# Patient Record
Sex: Female | Born: 1965 | Race: Black or African American | Hispanic: No | Marital: Single | Smoking: Current every day smoker
Health system: Southern US, Community
[De-identification: ages and names within clinical notes are randomized; demographics above are authoritative.]

## PROBLEM LIST (undated history)

## (undated) DIAGNOSIS — K5090 Crohn's disease, unspecified, without complications: Secondary | ICD-10-CM

## (undated) HISTORY — PX: CHOLECYSTECTOMY: SHX55

## (undated) HISTORY — PX: TUBAL LIGATION: SHX77

## (undated) HISTORY — PX: TONSILLECTOMY: SUR1361

## (undated) HISTORY — PX: ABDOMINAL SURGERY: SHX537

---

## 2004-08-13 ENCOUNTER — Emergency Department: Payer: Self-pay | Admitting: Emergency Medicine

## 2004-08-19 ENCOUNTER — Emergency Department: Payer: Self-pay | Admitting: Emergency Medicine

## 2004-09-02 ENCOUNTER — Emergency Department: Payer: Self-pay | Admitting: Emergency Medicine

## 2006-07-24 ENCOUNTER — Ambulatory Visit: Payer: Self-pay | Admitting: Unknown Physician Specialty

## 2006-11-09 ENCOUNTER — Ambulatory Visit: Payer: Self-pay | Admitting: Internal Medicine

## 2007-01-22 ENCOUNTER — Ambulatory Visit: Payer: Self-pay | Admitting: Gastroenterology

## 2007-03-05 ENCOUNTER — Ambulatory Visit: Payer: Self-pay | Admitting: Unknown Physician Specialty

## 2012-02-08 ENCOUNTER — Emergency Department (HOSPITAL_COMMUNITY)
Admission: EM | Admit: 2012-02-08 | Discharge: 2012-02-08 | Disposition: A | Discharge status: Home / Self Care | Payer: Self-pay | Attending: Emergency Medicine | Admitting: Emergency Medicine

## 2012-02-08 ENCOUNTER — Encounter (HOSPITAL_COMMUNITY): Payer: Self-pay | Admitting: Nurse Practitioner

## 2012-02-08 DIAGNOSIS — K5090 Crohn's disease, unspecified, without complications: Secondary | ICD-10-CM | POA: Insufficient documentation

## 2012-02-08 DIAGNOSIS — F17200 Nicotine dependence, unspecified, uncomplicated: Secondary | ICD-10-CM | POA: Insufficient documentation

## 2012-02-08 DIAGNOSIS — L0231 Cutaneous abscess of buttock: Secondary | ICD-10-CM | POA: Insufficient documentation

## 2012-02-08 DIAGNOSIS — Z880 Allergy status to penicillin: Secondary | ICD-10-CM | POA: Insufficient documentation

## 2012-02-08 HISTORY — DX: Crohn's disease, unspecified, without complications: K50.90

## 2012-02-08 MED ORDER — HYDROCODONE-ACETAMINOPHEN 5-325 MG PO TABS
1.0000 | ORAL_TABLET | Freq: Once | ORAL | Status: AC
  Administered 2012-02-08: 1 via ORAL

## 2012-02-08 MED ORDER — HYDROCODONE-ACETAMINOPHEN 5-325 MG PO TABS
ORAL_TABLET | ORAL | Status: AC
  Filled 2012-02-08: qty 1

## 2012-02-08 NOTE — ED Notes (Signed)
States " i have a cyst on my tailbone since last week." denies drainage. Reports painful at site

## 2012-02-08 NOTE — ED Provider Notes (Signed)
History     CSN: [id number]  Arrival date & time 02/08/12  7846   First MD Initiated Contact with Patient 02/08/12 [phone]      Chief Complaint  Patient presents with  . Cyst    (Consider location/radiation/quality/duration/timing/severity/associated sxs/prior treatment) HPI Comments: Patient presents with an abscess to the gluteal cleft. This has been present for almost a week now and has gradually been worsening. No drainage noted from the area. Patient states the area is very painful when she sits. She has had the area drained in the past and was told that she may have a pilonidal cyst. She was instructed to followup with surgery for this but has not done so as of yet. She denies fever or chills. She has not noticed any overlying skin redness to the area.  Patient is a 46 y.o. female presenting with abscess. The history is provided by the patient.  Abscess  This is a new problem. The current episode started less than one week ago. The onset was gradual. The problem occurs continuously. The problem has been gradually worsening. The abscess is present on the right buttock. The problem is severe. The abscess is characterized by painfulness. Pertinent negatives include no fever. She has received no recent medical care.    Past Medical History  Diagnosis Date  . Crohn's disease     Past Surgical History  Procedure Date  . Abdominal surgery     History reviewed. No pertinent family history.  History  Substance Use Topics  . Smoking status: Current Everyday Smoker -- 0.5 packs/day  . Smokeless tobacco: Not on file  . Alcohol Use: Yes     10    OB History    Grav Para Term Preterm Abortions TAB SAB Ect Mult Living                  Review of Systems  Constitutional: Negative for fever.  Gastrointestinal: Negative for anal bleeding and rectal pain.  Skin: Positive for wound. Negative for color change.    Allergies  Codeine; Penicillins; and Percocet  Home Medications    Current Outpatient Rx  Name Route Sig Dispense Refill  . CALCIUM CARBONATE ANTACID 500 MG PO CHEW Oral Chew 2 tablets by mouth 3 (three) times daily as needed. Stomach indigestion      BP 161/110  Pulse 113  Temp 98.6 F (37 C) (Oral)  Resp 20  Ht 5\' 2"  (1.575 m)  Wt 170 lb (77.111 kg)  BMI 31.09 kg/m2  SpO2 100% Tachycardic which is likely 2/2 pain; pt is uncomfortable  Physical Exam  Nursing note and vitals reviewed. Constitutional: She appears well-developed and well-nourished. No distress.       Uncomfortable appearing  HENT:  Head: Normocephalic and atraumatic.  Eyes:       Normal appearance  Neck: Normal range of motion.  Cardiovascular: Regular rhythm and normal heart sounds.  Tachycardia present.   Pulmonary/Chest: Effort normal and breath sounds normal.  Abdominal: Soft. There is no tenderness.  Musculoskeletal: Normal range of motion.  Neurological: She is alert.  Skin: Skin is warm and dry. She is not diaphoretic.       Small, ~1cm abscess to upper gluteal cleft, sm amount purulent drainage noted; no overlying cellullitis; area exquisitely tender to palp  Psychiatric: She has a normal mood and affect.    ED Course  Procedures (including critical care time)  INCISION AND DRAINAGE Performed by: Grant Fontana Consent: Verbal consent obtained. Risks and  benefits: risks, benefits and alternatives were discussed Type: abscess  Body area: gluteal cleft  Anesthesia: local infiltration  Local anesthetic: lidocaine 2% with epinephrine  Anesthetic total: 3 ml  Complexity: complex Blunt dissection to break up loculations  Drainage: purulent  Drainage amount: mod  Packing material: 1/4 in iodoform gauze  Patient tolerance: poorly    Labs Reviewed - No data to display No results found.   1. Abscess of buttock       MDM  Pt with poss pilonidal abscess. Area incised and drained which was productive of purulent material; it was packed.  Instructed to return in 2 days for a wound recheck. Wound care discussed. Instructed to f/u with surgery for further eval. Reasons to return to the ED discussed. Pt verbalized understanding, agreed to plan.       Grant Fontana, PA-C 02/10/12 [phone]

## 2012-02-10 NOTE — ED Provider Notes (Signed)
Medical screening examination/treatment/procedure(s) were performed by non-physician practitioner and as supervising physician I was immediately available for consultation/collaboration.  Esvin Hnat, MD 02/10/12 1253 

## 2012-03-05 ENCOUNTER — Ambulatory Visit (INDEPENDENT_AMBULATORY_CARE_PROVIDER_SITE_OTHER): Payer: Self-pay | Admitting: General Surgery

## 2012-03-08 ENCOUNTER — Encounter (INDEPENDENT_AMBULATORY_CARE_PROVIDER_SITE_OTHER): Payer: Self-pay | Admitting: General Surgery

## 2014-01-03 ENCOUNTER — Emergency Department (HOSPITAL_COMMUNITY)
Admission: EM | Admit: 2014-01-03 | Discharge: 2014-01-03 | Disposition: A | Discharge status: Home / Self Care | Payer: Managed Care, Other (non HMO) | Attending: Emergency Medicine | Admitting: Emergency Medicine

## 2014-01-03 ENCOUNTER — Emergency Department (HOSPITAL_COMMUNITY): Payer: Managed Care, Other (non HMO)

## 2014-01-03 ENCOUNTER — Encounter (HOSPITAL_COMMUNITY): Payer: Self-pay | Admitting: Emergency Medicine

## 2014-01-03 DIAGNOSIS — Z9851 Tubal ligation status: Secondary | ICD-10-CM | POA: Insufficient documentation

## 2014-01-03 DIAGNOSIS — Z3202 Encounter for pregnancy test, result negative: Secondary | ICD-10-CM | POA: Insufficient documentation

## 2014-01-03 DIAGNOSIS — Z880 Allergy status to penicillin: Secondary | ICD-10-CM | POA: Insufficient documentation

## 2014-01-03 DIAGNOSIS — IMO0002 Reserved for concepts with insufficient information to code with codable children: Secondary | ICD-10-CM | POA: Insufficient documentation

## 2014-01-03 DIAGNOSIS — F17200 Nicotine dependence, unspecified, uncomplicated: Secondary | ICD-10-CM | POA: Insufficient documentation

## 2014-01-03 DIAGNOSIS — Z79899 Other long term (current) drug therapy: Secondary | ICD-10-CM | POA: Insufficient documentation

## 2014-01-03 DIAGNOSIS — Z792 Long term (current) use of antibiotics: Secondary | ICD-10-CM | POA: Insufficient documentation

## 2014-01-03 DIAGNOSIS — K5000 Crohn's disease of small intestine without complications: Secondary | ICD-10-CM | POA: Insufficient documentation

## 2014-01-03 DIAGNOSIS — Z9089 Acquired absence of other organs: Secondary | ICD-10-CM | POA: Insufficient documentation

## 2014-01-03 LAB — CBC WITH DIFFERENTIAL/PLATELET
BASOS ABS: 0.1 10*3/uL (ref 0.0–0.1)
BASOS PCT: 1 % (ref 0–1)
Eosinophils Absolute: 0.3 10*3/uL (ref 0.0–0.7)
Eosinophils Relative: 2 % (ref 0–5)
HEMATOCRIT: 35.8 % — AB (ref 36.0–46.0)
Hemoglobin: 11.5 g/dL — ABNORMAL LOW (ref 12.0–15.0)
Lymphocytes Relative: 33 % (ref 12–46)
Lymphs Abs: 3.4 10*3/uL (ref 0.7–4.0)
MCH: 27.5 pg (ref 26.0–34.0)
MCHC: 32.1 g/dL (ref 30.0–36.0)
MCV: 85.6 fL (ref 78.0–100.0)
MONO ABS: 0.7 10*3/uL (ref 0.1–1.0)
Monocytes Relative: 7 % (ref 3–12)
NEUTROS ABS: 5.9 10*3/uL (ref 1.7–7.7)
Neutrophils Relative %: 57 % (ref 43–77)
PLATELETS: 452 10*3/uL — AB (ref 150–400)
RBC: 4.18 MIL/uL (ref 3.87–5.11)
RDW: 17.8 % — AB (ref 11.5–15.5)
WBC: 10.4 10*3/uL (ref 4.0–10.5)

## 2014-01-03 LAB — COMPREHENSIVE METABOLIC PANEL
ALBUMIN: 3.6 g/dL (ref 3.5–5.2)
ALT: 12 U/L (ref 0–35)
AST: 30 U/L (ref 0–37)
Alkaline Phosphatase: 140 U/L — ABNORMAL HIGH (ref 39–117)
BILIRUBIN TOTAL: 0.3 mg/dL (ref 0.3–1.2)
BUN: 9 mg/dL (ref 6–23)
CALCIUM: 9.6 mg/dL (ref 8.4–10.5)
CHLORIDE: 102 meq/L (ref 96–112)
CO2: 22 mEq/L (ref 19–32)
CREATININE: 1 mg/dL (ref 0.50–1.10)
GFR calc Af Amer: 78 mL/min — ABNORMAL LOW (ref >90)
GFR calc non Af Amer: 67 mL/min — ABNORMAL LOW (ref >90)
Glucose, Bld: 106 mg/dL — ABNORMAL HIGH (ref 70–99)
Potassium: 4.3 mEq/L (ref 3.7–5.3)
SODIUM: 136 meq/L — AB (ref 137–147)
Total Protein: 7.7 g/dL (ref 6.0–8.3)

## 2014-01-03 LAB — URINALYSIS, ROUTINE W REFLEX MICROSCOPIC
Bilirubin Urine: NEGATIVE
GLUCOSE, UA: NEGATIVE mg/dL
Hgb urine dipstick: NEGATIVE
Ketones, ur: NEGATIVE mg/dL
LEUKOCYTES UA: NEGATIVE
Nitrite: NEGATIVE
PH: 5.5 (ref 5.0–8.0)
Protein, ur: NEGATIVE mg/dL
Specific Gravity, Urine: 1.02 (ref 1.005–1.030)
Urobilinogen, UA: 0.2 mg/dL (ref 0.0–1.0)

## 2014-01-03 LAB — PREGNANCY, URINE: PREG TEST UR: NEGATIVE

## 2014-01-03 LAB — LIPASE, BLOOD: Lipase: 36 U/L (ref 11–59)

## 2014-01-03 MED ORDER — IOHEXOL 300 MG/ML  SOLN
20.0000 mL | INTRAMUSCULAR | Status: DC
  Administered 2014-01-03: 20 mL via ORAL

## 2014-01-03 MED ORDER — PREDNISONE 20 MG PO TABS
60.0000 mg | ORAL_TABLET | Freq: Once | ORAL | Status: AC
  Administered 2014-01-03: 60 mg via ORAL
  Filled 2014-01-03: qty 3

## 2014-01-03 MED ORDER — HYDROCODONE-ACETAMINOPHEN 5-325 MG PO TABS: 2.0000 | ORAL_TABLET | ORAL | Status: AC | PRN

## 2014-01-03 MED ORDER — CIPROFLOXACIN HCL 500 MG PO TABS
500.0000 mg | ORAL_TABLET | Freq: Once | ORAL | Status: AC
  Administered 2014-01-03: 500 mg via ORAL
  Filled 2014-01-03: qty 1

## 2014-01-03 MED ORDER — ONDANSETRON HCL 4 MG/2ML IJ SOLN
4.0000 mg | Freq: Once | INTRAMUSCULAR | Status: AC
  Administered 2014-01-03: 4 mg via INTRAVENOUS
  Filled 2014-01-03: qty 2

## 2014-01-03 MED ORDER — CIPROFLOXACIN HCL 500 MG PO TABS: 500.0000 mg | ORAL_TABLET | Freq: Two times a day (BID) | ORAL | Status: AC

## 2014-01-03 MED ORDER — IOHEXOL 300 MG/ML  SOLN: 100.0000 mL | Freq: Once | INTRAMUSCULAR | Status: DC | PRN

## 2014-01-03 MED ORDER — PREDNISONE 20 MG PO TABS: 60.0000 mg | ORAL_TABLET | Freq: Every day | ORAL | Status: AC

## 2014-01-03 MED ORDER — MORPHINE SULFATE 4 MG/ML IJ SOLN
4.0000 mg | Freq: Once | INTRAMUSCULAR | Status: AC
  Administered 2014-01-03: 4 mg via INTRAVENOUS
  Filled 2014-01-03: qty 1

## 2014-01-03 NOTE — Discharge Instructions (Signed)
Crohn's Disease Crohn's disease is a long-term (chronic) soreness and redness (inflammation) of the intestines (bowel). It can affect any portion of the digestive tract, from the mouth to the anus. It can also cause problems outside the digestive tract. Crohn's disease is closely related to a disease called ulcerative colitis (together, these two diseases are called inflammatory bowel disease).  CAUSES  The cause of Crohn's disease is not known. One theory is that, in an easily affected person, the immune system is triggered to attack the body's own digestive tissue. Crohn's disease runs in families. It seems to be more common in certain geographic areas and amongst certain races. There are no clear-cut dietary causes.  SYMPTOMS  Crohn's disease can cause many different symptoms since it can affect many different parts of the body. Symptoms include:  Fatigue.  Weight loss.  Chronic diarrhea, sometime bloody.  Abdominal pain and cramps.  Fever.  Ulcers or canker sores in the mouth or rectum.  Anemia (low red blood cells).  Arthritis, skin problems, and eye problems may occur. Complications of Crohn's disease can include:  Series of holes (perforation) of the bowel.  Portions of the intestines sticking to each other (adhesions).  Obstruction of the bowel.  Fistula formation, typically in the rectal area but also in other areas. A fistula is an opening between the bowels and the outside, or between the bowels and another organ.  A painful crack in the mucous membrane of the anus (rectal fissure). DIAGNOSIS  Your caregiver may suspect Crohn's disease based on your symptoms and an exam. Blood tests may confirm that there is a problem. You may be asked to submit a stool specimen for examination. X-rays and CT scans may be necessary. Ultimately, the diagnosis is usually made after a procedure that uses a flexible tube that is inserted via your mouth or your anus. This is done under  sedation and is called either an upper endoscopy or colonoscopy. With these tests, the specialist can take tiny tissue samples and remove them from the inside of the bowel (biopsy). Examination of this biopsy tissue under a microscope can reveal Crohn's disease as the cause of your symptoms. Due to the many different forms that Crohn's disease can take, symptoms may be present for several years before a diagnosis is made. TREATMENT  Medications are often used to decrease inflammation and control the immune system. These include medicines related to aspirin, steroid medications, and newer and stronger medications to slow down the immune system. Some medications may be used as suppositories or enemas. A number of other medications are used or have been studied. Your caregiver will make specific recommendations. HOME CARE INSTRUCTIONS   Symptoms such as diarrhea can be controlled with medications. Avoid foods that have a laxative effect such as fresh fruit, vegetables and dairy products. During flare ups, you can rest your bowel by refraining from solid foods. Drink clear liquids frequently during the day (electrolyte or re-hydrating fluids are best. Your caregiver can help you with suggestions). Drink often to prevent loss of body fluids (dehydration). When diarrhea has cleared, eat small meals and more frequently. Avoid food additives and stimulants such as caffeine (coffee, tea, or chocolate). Enzyme supplements may help if you develop intolerance to a sugar in dairy products (lactose). Ask your caregiver or dietitian about specific dietary instructions.  Try to maintain a positive attitude. Learn relaxation techniques such as self hypnosis, mental imaging, and muscle relaxation.  If possible, avoid stresses which can aggravate your condition.    Exercise regularly.  Follow your diet.  Always get plenty of rest. SEEK MEDICAL CARE IF:   Your symptoms fail to improve after a week or two of new  treatment.  You experience continued weight loss.  You have ongoing cramps or loose bowels.  You develop a new skin rash, skin sores, or eye problems. SEEK IMMEDIATE MEDICAL CARE IF:   You have worsening of your symptoms or develop new symptoms.  You have a fever.  You develop bloody diarrhea.  You develop severe abdominal pain. MAKE SURE YOU:   Understand these instructions.  Will watch your condition.  Will get help right away if you are not doing well or get worse. Document Released: 04/30/2005 Document Revised: 11/15/2012 Document Reviewed: 03/29/2007 [employer] Patient Information 2014 [employer].  

## 2014-01-03 NOTE — ED Notes (Signed)
CT notified that patient has finished oral contrast.

## 2014-01-03 NOTE — ED Provider Notes (Signed)
CSN: [id number]     Arrival date & time 01/03/14  0326 History   First MD Initiated Contact with Patient 01/03/14 0344     Chief Complaint  Patient presents with  . Abdominal Pain     (Consider location/radiation/quality/duration/timing/severity/associated sxs/prior Treatment) HPI  48 year old female presents to emergency department from home with complaint of abdominal pain.  Patient has history of Crohn's disease.  She had surgery 2 years ago, had cecum removed.  She was told at that time that she had a large hernia and would need to followup with the surgeon.  She is followed up on June 10 at [facility] with her surgeon.  Patient reports over the last 3 weeks she has had worsening abdominal pain.  Pain is now constant.  She denies nausea vomiting or diarrhea.  She reports that her abdomen is distended.  She has had some constipation over the last few days. Past Medical History  Diagnosis Date  . Crohn's disease    Past Surgical History  Procedure Laterality Date  . Abdominal surgery    . Cholecystectomy    . Tonsillectomy    . Tubal ligation     No family history on file. History  Substance Use Topics  . Smoking status: Current Every Day Smoker -- 0.50 packs/day  . Smokeless tobacco: Not on file  . Alcohol Use: Yes     Comment: 10   OB History   Grav Para Term Preterm Abortions TAB SAB Ect Mult Living                 Review of Systems  See History of Present Illness; otherwise all other systems are reviewed and negative   Allergies  Codeine; Penicillins; and Percocet  Home Medications   Prior to Admission medications   Medication Sig Start Date End Date Taking? Authorizing Provider  calcium carbonate (TUMS - DOSED IN MG ELEMENTAL CALCIUM) 500 MG chewable tablet Chew 2 tablets by mouth 3 (three) times daily as needed. Stomach indigestion   Yes Historical Provider, MD  ciprofloxacin (CIPRO) 500 MG tablet Take 1 tablet (500 mg total) by mouth 2 (two) times daily. 01/03/14    Kalman Drape, MD  HYDROcodone-acetaminophen (NORCO/VICODIN) 5-325 MG per tablet Take 2 tablets by mouth every 4 (four) hours as needed for moderate pain or severe pain. 01/03/14   Kalman Drape, MD  predniSONE (DELTASONE) 20 MG tablet Take 3 tablets (60 mg total) by mouth daily. 01/03/14   Kalman Drape, MD   BP 139/84  Pulse 74  Temp(Src) 97.3 F (36.3 C) (Oral)  Resp 15  Ht 5\' 3"  (1.6 m)  Wt 168 lb (76.204 kg)  BMI 29.77 kg/m2  SpO2 100% Physical Exam  Nursing note and vitals reviewed. Constitutional: She is oriented to person, place, and time. She appears well-developed and well-nourished. She appears distressed (uncomfortable appearing).  HENT:  Head: Normocephalic and atraumatic.  Right Ear: External ear normal.  Left Ear: External ear normal.  Nose: Nose normal.  Mouth/Throat: Oropharynx is clear and moist.  Eyes: Conjunctivae and EOM are normal. Pupils are equal, round, and reactive to light.  Neck: Normal range of motion. Neck supple. No JVD present. No tracheal deviation present. No thyromegaly present.  Cardiovascular: Normal rate, regular rhythm, normal heart sounds and intact distal pulses.  Exam reveals no gallop and no friction rub.   No murmur heard. Pulmonary/Chest: Effort normal and breath sounds normal. No stridor. No respiratory distress. She has no wheezes. She has  no rales. She exhibits no tenderness.  Abdominal: Soft. Bowel sounds are normal. She exhibits mass (patient has a ventral hernia that is easily reducible.). She exhibits no distension. There is tenderness (patient has diffuse midline tenderness). There is no rebound and no guarding.  Musculoskeletal: Normal range of motion. She exhibits no edema and no tenderness.  Lymphadenopathy:    She has no cervical adenopathy.  Neurological: She is alert and oriented to person, place, and time. She exhibits normal muscle tone. Coordination normal.  Skin: Skin is warm and dry. No rash noted. No erythema. No pallor.   Psychiatric: She has a normal mood and affect. Her behavior is normal. Judgment and thought content normal.    ED Course  Procedures (including critical care time) Labs Review Labs Reviewed  CBC WITH DIFFERENTIAL - Abnormal; Notable for the following:    Hemoglobin 11.5 (*)    HCT 35.8 (*)    RDW 17.8 (*)    Platelets 452 (*)    All other components within normal limits  COMPREHENSIVE METABOLIC PANEL - Abnormal; Notable for the following:    Sodium 136 (*)    Glucose, Bld 106 (*)    Alkaline Phosphatase 140 (*)    GFR calc non Af Amer 67 (*)    GFR calc Af Amer 78 (*)    All other components within normal limits  LIPASE, BLOOD  URINALYSIS, ROUTINE W REFLEX MICROSCOPIC  PREGNANCY, URINE    Imaging Review Ct Abdomen Pelvis W Contrast  01/03/2014   CLINICAL DATA:  Crohn's disease, hernia, 3 week abdominal pain and distention.  EXAM: CT ABDOMEN AND PELVIS WITH CONTRAST  TECHNIQUE: Multidetector CT imaging of the abdomen and pelvis was performed using the standard protocol following bolus administration of intravenous contrast.  CONTRAST:  100 cc Omnipaque 300  COMPARISON:  None.  FINDINGS: Included view of the lung bases are clear. Visualized heart and pericardium are unremarkable.  The liver, spleen, pancreas and adrenal glands are unremarkable. Status post cholecystectomy.  Status post apparent sick CT scanning, with circumferential wall thickening, mesenteric edema of the distal ileum extending to the surgical anastomosis, coronal 44/111. Enteric contrast has not yet reached the distal small bowel. No pneumatosis, no abscess. The stomach, large bowel are normal in course and caliber without inflammatory changes. No intraperitoneal free fluid nor free air.  Kidneys are orthotopic, demonstrating symmetric enhancement without nephrolithiasis, hydronephrosis or renal masses. The unopacified ureters are normal in course and caliber. Delayed imaging through the kidneys demonstrates symmetric  prompt excretion to the proximal urinary collecting system. Urinary bladder is partially distended and unremarkable.  Aortoiliac vessels are normal in course and caliber. No lymphadenopathy by CT size criteria. 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 Moderate fat containing ventral hernia.  IMPRESSION: Inflammatory changes of the distal ileum likely reflecting acute Crohn's flare, without abscess or obstruction, status post apparent cecectomy.   Electronically Signed   By: Elon Alas   On: 01/03/2014 05:34     EKG Interpretation   Date/Time:  Tuesday January 03 2014 03:30:34 EDT Ventricular Rate:  100 PR Interval:  168 QRS Duration: 74 QT Interval:  344 QTC Calculation: 443 R Axis:   37 Text Interpretation:  Normal sinus rhythm Septal infarct , age  undetermined Abnormal ECG No old tracing to compare Confirmed by Michaelpaul Apo   MD, Tyson Masin ([id number]) on 01/03/2014 5:38:02 AM      MDM   Final diagnoses:  Crohn's disease involving terminal  ileum   48 year old female with abdominal pain, history of Crohn's disease, bowel obstruction, and prior bowel resection.  She has known hernia.  Plan for labs, CT scan.  Differential includes incarcerated hernia, small bowel obstruction, Crohn's flare.   Patient with Crohn's flare.  We'll send home with Cipro and prednisone.  She has good followup established.  Kalman Drape, MD 01/03/14 [phone]

## 2014-01-03 NOTE — ED Notes (Signed)
Pt had surgery for Crohn's Dz in 2011 and was told after she had a hernia.  Pt is now complaining of abdominal pain for 3 weeks and abdomen is distended

## 2014-01-03 NOTE — ED Notes (Signed)
Patient returned from CT

## 2014-01-21 ENCOUNTER — Emergency Department (HOSPITAL_BASED_OUTPATIENT_CLINIC_OR_DEPARTMENT_OTHER): Payer: Managed Care, Other (non HMO)

## 2014-01-21 ENCOUNTER — Encounter (HOSPITAL_BASED_OUTPATIENT_CLINIC_OR_DEPARTMENT_OTHER): Payer: Self-pay | Admitting: Emergency Medicine

## 2014-01-21 ENCOUNTER — Emergency Department (HOSPITAL_COMMUNITY): Payer: Managed Care, Other (non HMO)

## 2014-01-21 ENCOUNTER — Inpatient Hospital Stay (HOSPITAL_COMMUNITY): Payer: Managed Care, Other (non HMO)

## 2014-01-21 ENCOUNTER — Inpatient Hospital Stay (HOSPITAL_BASED_OUTPATIENT_CLINIC_OR_DEPARTMENT_OTHER)
Admission: EM | Admit: 2014-01-21 | Discharge: 2014-01-21 | Disposition: A | Discharge status: Home / Self Care | Payer: Managed Care, Other (non HMO) | Attending: Obstetrics & Gynecology | Admitting: Obstetrics & Gynecology

## 2014-01-21 DIAGNOSIS — R102 Pelvic and perineal pain: Secondary | ICD-10-CM

## 2014-01-21 DIAGNOSIS — D259 Leiomyoma of uterus, unspecified: Secondary | ICD-10-CM

## 2014-01-21 DIAGNOSIS — F17200 Nicotine dependence, unspecified, uncomplicated: Secondary | ICD-10-CM | POA: Insufficient documentation

## 2014-01-21 DIAGNOSIS — K5090 Crohn's disease, unspecified, without complications: Secondary | ICD-10-CM | POA: Insufficient documentation

## 2014-01-21 DIAGNOSIS — N949 Unspecified condition associated with female genital organs and menstrual cycle: Secondary | ICD-10-CM

## 2014-01-21 DIAGNOSIS — A5901 Trichomonal vulvovaginitis: Secondary | ICD-10-CM | POA: Insufficient documentation

## 2014-01-21 DIAGNOSIS — R52 Pain, unspecified: Secondary | ICD-10-CM | POA: Insufficient documentation

## 2014-01-21 DIAGNOSIS — A599 Trichomoniasis, unspecified: Secondary | ICD-10-CM

## 2014-01-21 DIAGNOSIS — N920 Excessive and frequent menstruation with regular cycle: Secondary | ICD-10-CM | POA: Insufficient documentation

## 2014-01-21 DIAGNOSIS — R109 Unspecified abdominal pain: Secondary | ICD-10-CM | POA: Insufficient documentation

## 2014-01-21 LAB — URINALYSIS, ROUTINE W REFLEX MICROSCOPIC
Bilirubin Urine: NEGATIVE
Glucose, UA: NEGATIVE mg/dL
Ketones, ur: NEGATIVE mg/dL
LEUKOCYTES UA: NEGATIVE
NITRITE: NEGATIVE
Protein, ur: NEGATIVE mg/dL
SPECIFIC GRAVITY, URINE: 1.01 (ref 1.005–1.030)
UROBILINOGEN UA: 0.2 mg/dL (ref 0.0–1.0)
pH: 5.5 (ref 5.0–8.0)

## 2014-01-21 LAB — URINE MICROSCOPIC-ADD ON

## 2014-01-21 LAB — WET PREP, GENITAL: Yeast Wet Prep HPF POC: NONE SEEN

## 2014-01-21 LAB — CBC WITH DIFFERENTIAL/PLATELET
BASOS ABS: 0 10*3/uL (ref 0.0–0.1)
Basophils Relative: 0 % (ref 0–1)
EOS PCT: 3 % (ref 0–5)
Eosinophils Absolute: 0.2 10*3/uL (ref 0.0–0.7)
HEMATOCRIT: 33.1 % — AB (ref 36.0–46.0)
Hemoglobin: 10.8 g/dL — ABNORMAL LOW (ref 12.0–15.0)
LYMPHS PCT: 33 % (ref 12–46)
Lymphs Abs: 2.7 10*3/uL (ref 0.7–4.0)
MCH: 27.9 pg (ref 26.0–34.0)
MCHC: 32.6 g/dL (ref 30.0–36.0)
MCV: 85.5 fL (ref 78.0–100.0)
MONO ABS: 0.8 10*3/uL (ref 0.1–1.0)
Monocytes Relative: 9 % (ref 3–12)
Neutro Abs: 4.4 10*3/uL (ref 1.7–7.7)
Neutrophils Relative %: 54 % (ref 43–77)
Platelets: 394 10*3/uL (ref 150–400)
RBC: 3.87 MIL/uL (ref 3.87–5.11)
RDW: 18.3 % — AB (ref 11.5–15.5)
WBC: 8.2 10*3/uL (ref 4.0–10.5)

## 2014-01-21 LAB — BASIC METABOLIC PANEL
BUN: 7 mg/dL (ref 6–23)
CALCIUM: 10.2 mg/dL (ref 8.4–10.5)
CO2: 20 meq/L (ref 19–32)
CREATININE: 0.7 mg/dL (ref 0.50–1.10)
Chloride: 102 mEq/L (ref 96–112)
GFR calc Af Amer: >90 mL/min (ref >90)
GFR calc non Af Amer: >90 mL/min (ref >90)
Glucose, Bld: 109 mg/dL — ABNORMAL HIGH (ref 70–99)
Potassium: 3.9 mEq/L (ref 3.7–5.3)
Sodium: 137 mEq/L (ref 137–147)

## 2014-01-21 LAB — PREGNANCY, URINE: PREG TEST UR: NEGATIVE

## 2014-01-21 MED ORDER — DICLOFENAC SODIUM ER 100 MG PO TB24: 100.0000 mg | ORAL_TABLET | Freq: Every day | ORAL | Status: AC

## 2014-01-21 MED ORDER — FENTANYL CITRATE 0.05 MG/ML IJ SOLN
100.0000 ug | Freq: Once | INTRAMUSCULAR | Status: AC
  Administered 2014-01-21: 100 ug via INTRAVENOUS
  Filled 2014-01-21: qty 2

## 2014-01-21 MED ORDER — ONDANSETRON HCL 4 MG/2ML IJ SOLN
4.0000 mg | Freq: Once | INTRAMUSCULAR | Status: AC
  Administered 2014-01-21: 4 mg via INTRAVENOUS
  Filled 2014-01-21: qty 2

## 2014-01-21 MED ORDER — FERROUS SULFATE 325 (65 FE) MG PO TABS: 325.0000 mg | ORAL_TABLET | Freq: Every day | ORAL | Status: AC

## 2014-01-21 MED ORDER — KETOROLAC TROMETHAMINE 60 MG/2ML IM SOLN
60.0000 mg | Freq: Once | INTRAMUSCULAR | Status: AC
  Administered 2014-01-21: 60 mg via INTRAMUSCULAR
  Filled 2014-01-21: qty 2

## 2014-01-21 MED ORDER — METRONIDAZOLE 500 MG PO TABS
2000.0000 mg | ORAL_TABLET | Freq: Once | ORAL | Status: AC
  Administered 2014-01-21: 2000 mg via ORAL
  Filled 2014-01-21: qty 4

## 2014-01-21 MED ORDER — SODIUM CHLORIDE 0.9 % IV SOLN
INTRAVENOUS | Status: DC
  Administered 2014-01-21: 01:00:00 via INTRAVENOUS

## 2014-01-21 NOTE — ED Notes (Signed)
Abdominal pain and pain in her rectal area since 11pm.

## 2014-01-21 NOTE — Discharge Instructions (Signed)
Endometrial Biopsy Endometrial biopsy is a procedure in which a tissue sample is taken from inside the uterus. The tissue sample is then looked at under a microscope to see if the tissue is normal or abnormal. The endometrium is the lining of the uterus. This procedure helps determine where you are in your menstrual cycle and how hormone levels are affecting the lining of the uterus. This procedure may also be used to evaluate uterine bleeding or to diagnose endometrial cancer, tuberculosis, polyps, or inflammatory conditions.  LET [facility] CARE PROVIDER KNOW ABOUT:  Any allergies you have.  All medicines you are taking, including vitamins, herbs, eye drops, creams, and over-the-counter medicines.  Previous problems you or members of your family have had with the use of anesthetics.  Any blood disorders you have.  Previous surgeries you have had.  Medical conditions you have.  Possibility of pregnancy. RISKS AND COMPLICATIONS Generally, this is a safe procedure. However, as with any procedure, complications can occur. Possible complications include:  Bleeding.  Pelvic infection.  Puncture of the uterine wall with the biopsy device (rare). BEFORE THE PROCEDURE   Keep a record of your menstrual cycles as directed by your health care provider. You may need to schedule your procedure for a specific time in your cycle.  You may want to bring a sanitary pad to wear home after the procedure.  Arrange for someone to drive you home after the procedure if you will be given a medicine to help you relax (sedative). PROCEDURE   You may be given a sedative to relax you.  You will lie on an exam table with your feet and legs supported as in a pelvic exam.  Your health care provider will insert an instrument (speculum) into your vagina to see your cervix.  Your cervix will be cleansed with an antiseptic solution. A medicine (local anesthetic) will be used to numb the cervix.  A forceps  instrument (tenaculum) will be used to hold your cervix steady for the biopsy.  A thin, rodlike instrument (uterine sound) will be inserted through your cervix to determine the length of your uterus and the location where the biopsy sample will be removed.  A thin, flexible tube (catheter) will be inserted through your cervix and into the uterus. The catheter is used to collect the biopsy sample from your endometrial tissue.  The catheter and speculum will then be removed, and the tissue sample will be sent to a lab for examination. AFTER THE PROCEDURE  You will rest in a recovery area until you are ready to go home.  You may have mild cramping and a small amount of vaginal bleeding for a few days after the procedure. This is normal.  Make sure you find out how to get your test results. Document Released: 11/21/2004 Document Revised: 03/23/2013 Document Reviewed: 01/05/2013 [facility] Patient Information 2015 [place], [place]. This information is not intended to replace advice given to you by your health care provider. Make sure you discuss any questions you have with your health care provider. Fibroids Fibroids are lumps (tumors) that can occur any place in a woman's body. These lumps are not cancerous. Fibroids vary in size, weight, and where they grow. HOME CARE  Do not take aspirin.  Write down the number of pads or tampons you use during your period. Tell your doctor. This can help determine the best treatment for you. GET HELP RIGHT AWAY IF:  You have pain in your lower belly (abdomen) that is not  helped with medicine.  You have cramps that are not helped with medicine.  You have more bleeding between or during your period.  You feel lightheaded or pass out (faint).  Your lower belly pain gets worse. MAKE SURE YOU:  Understand these instructions.  Will watch your condition.  Will get help right away if you are not doing well or get worse. Document Released: 08/23/2010  Document Revised: 10/13/2011 Document Reviewed: 08/23/2010 [facility] Patient Information 2015 [place], [place]. This information is not intended to replace advice given to you by your health care provider. Make sure you discuss any questions you have with your health care provider. Trichomoniasis Trichomoniasis is an infection caused by an organism called Trichomonas. The infection can affect both women and men. In women, the outer female genitalia and the vagina are affected. In men, the penis is mainly affected, but the prostate and other reproductive organs can also be involved. Trichomoniasis is a sexually transmitted infection (STI) and is most often passed to another person through sexual contact.  RISK FACTORS  Having unprotected sexual intercourse.  Having sexual intercourse with an infected partner. SIGNS AND SYMPTOMS  Symptoms of trichomoniasis in women include:  Abnormal gray-green frothy vaginal discharge.  Itching and irritation of the vagina.  Itching and irritation of the area outside the vagina. Symptoms of trichomoniasis in men include:   Penile discharge with or without pain.  Pain during urination. This results from inflammation of the urethra. DIAGNOSIS  Trichomoniasis may be found during a Pap test or physical exam. Your health care provider may use one of the following methods to help diagnose this infection:  Examining vaginal discharge under a microscope. For men, urethral discharge would be examined.  Testing the pH of the vagina with a test tape.  Using a vaginal swab test that checks for the Trichomonas organism. A test is available that provides results within a few minutes.  Doing a culture test for the organism. This is not usually needed. TREATMENT   You may be given medicine to fight the infection. Women should inform their health care provider if they could be or are pregnant. Some medicines used to treat the infection should not be taken during  pregnancy.  Your health care provider may recommend over-the-counter medicines or creams to decrease itching or irritation.  Your sexual partner will need to be treated if infected. HOME CARE INSTRUCTIONS   Take all medicine prescribed by your health care provider.  Take over-the-counter medicine for itching or irritation as directed by your health care provider.  Do not have sexual intercourse while you have the infection.  Women should not douche or wear tampons while they have the infection.  Discuss your infection with your partner. Your partner may have gotten the infection from you, or you may have gotten it from your partner.  Have your sex partner get examined and treated if necessary.  Practice safe, informed, and protected sex.  See your health care provider for other STI testing. SEEK MEDICAL CARE IF:   You still have symptoms after you finish your medicine.  You develop abdominal pain.  You have pain when you urinate.  You have bleeding after sexual intercourse.  You develop a rash.  Your medicine makes you sick or makes you throw up (vomit). Document Released: 01/14/2001 Document Revised: 07/26/2013 Document Reviewed: 05/02/2013 [facility] Patient Information 2015 [place], [place]. This information is not intended to replace advice given to you by your health care provider. Make sure you discuss any  questions you have with your health care provider.

## 2014-01-21 NOTE — ED Notes (Signed)
Patient transported to X-ray 

## 2014-01-21 NOTE — MAU Note (Signed)
Arrival from [facility], for further eval of lower abd pain

## 2014-01-21 NOTE — MAU Provider Note (Signed)
History     CSN: [id number]  Arrival date and time: 01/21/14 4481   First Shyia Fillingim Initiated Contact with Patient 01/21/14 0424      Chief Complaint  Patient presents with  . Abdominal Pain   HPI Ms. Jill Terrell is a 48 y.o. female who presents to MAU from [facility] for further evaluation of acute pelvic pain. The patient states a sudden onset pelvic pain earlier today. The patient has a history of crohn's disease and had a recent flare that was treated and has resolved. She states pelvic pain started at 11:00 pm last night. She denies vaginal bleeding or discharge today. Labs at [facility] were positive for trichomonas. The patient was treated with Flagyl. She had a CT scant that showed no acute bowel concerns and a possible uterine fibroid. Due to the sudden onset Dr. Florina Ou is concerned for possible ovarian cyst causing possible ovarian torsion. [place] is not available at [facility] at this time. Patient was transferred in stable condition for [place] today.   OB History   Grav Para Term Preterm Abortions TAB SAB Ect Mult Living                  Past Medical History  Diagnosis Date  . Crohn's disease     Past Surgical History  Procedure Laterality Date  . Abdominal surgery    . Cholecystectomy    . Tonsillectomy    . Tubal ligation      History reviewed. No pertinent family history.  History  Substance Use Topics  . Smoking status: Current Every Day Smoker -- 0.50 packs/day  . Smokeless tobacco: Not on file  . Alcohol Use: Yes     Comment: 10    Allergies:  Allergies  Allergen Reactions  . Codeine Other (See Comments)    Heart fluttering, tightness in chest and shortness of breath  . Penicillins Hives  . Percocet [Oxycodone-Acetaminophen] Itching    Prescriptions prior to admission  Medication Sig Dispense Refill  . calcium carbonate (TUMS - DOSED IN MG ELEMENTAL CALCIUM) 500 MG chewable tablet Chew 2 tablets by mouth 3 (three) times daily as needed. Stomach indigestion      .  ciprofloxacin (CIPRO) 500 MG tablet Take 1 tablet (500 mg total) by mouth 2 (two) times daily.  14 tablet  0  . HYDROcodone-acetaminophen (NORCO/VICODIN) 5-325 MG per tablet Take 2 tablets by mouth every 4 (four) hours as needed for moderate pain or severe pain.  25 tablet  0  . predniSONE (DELTASONE) 20 MG tablet Take 3 tablets (60 mg total) by mouth daily.  15 tablet  0    Review of Systems  Constitutional: Negative for fever and malaise/fatigue.  Gastrointestinal: Positive for abdominal pain. Negative for nausea, vomiting, diarrhea and constipation.  Genitourinary: Negative for dysuria, urgency and frequency.       Neg - vaginal bleeding, discharge   Physical Exam   Blood pressure 144/93, pulse 72, temperature 98.7 F (37.1 C), temperature source Oral, resp. rate 18, SpO2 100.00%.  Physical Exam  Constitutional: She is oriented to person, place, and time. She appears well-developed and well-nourished. No distress.  HENT:  Head: Normocephalic.  Cardiovascular: Normal rate.   Respiratory: Effort normal.  GI: Soft. She exhibits no distension and no mass. There is tenderness (mild tenderness to palpation of the lower abdomen bilaterally). There is no rebound and no guarding.  Neurological: She is alert and oriented to person, place, and time.  Skin: Skin is warm and dry.  No erythema.  Psychiatric: She has a normal mood and affect.   Results for orders placed during the hospital encounter of 01/21/14 (from the past 24 hour(s))  URINALYSIS, ROUTINE W REFLEX MICROSCOPIC     Status: Abnormal   Collection Time    01/21/14 12:51 AM      Result Value Ref Range   Color, Urine YELLOW  YELLOW   APPearance CLEAR  CLEAR   Specific Gravity, Urine 1.010  1.005 - 1.030   pH 5.5  5.0 - 8.0   Glucose, UA NEGATIVE  NEGATIVE mg/dL   Hgb urine dipstick SMALL (*) NEGATIVE   Bilirubin Urine NEGATIVE  NEGATIVE   Ketones, ur NEGATIVE  NEGATIVE mg/dL   Protein, ur NEGATIVE  NEGATIVE mg/dL    Urobilinogen, UA 0.2  0.0 - 1.0 mg/dL   Nitrite NEGATIVE  NEGATIVE   Leukocytes, UA NEGATIVE  NEGATIVE  URINE MICROSCOPIC-ADD ON     Status: None   Collection Time    01/21/14 12:51 AM      Result Value Ref Range   Squamous Epithelial / LPF RARE  RARE   WBC, UA 3-6  <3 WBC/hpf   RBC / HPF 0-2  <3 RBC/hpf   Bacteria, UA RARE  RARE   Urine-Other TRICHOMONAS PRESENT    PREGNANCY, URINE     Status: None   Collection Time    01/21/14 12:51 AM      Result Value Ref Range   Preg Test, Ur NEGATIVE  NEGATIVE  CBC WITH DIFFERENTIAL     Status: Abnormal   Collection Time    01/21/14  1:15 AM      Result Value Ref Range   WBC 8.2  4.0 - 10.5 K/uL   RBC 3.87  3.87 - 5.11 MIL/uL   Hemoglobin 10.8 (*) 12.0 - 15.0 g/dL   HCT 33.1 (*) 36.0 - 46.0 %   MCV 85.5  78.0 - 100.0 fL   MCH 27.9  26.0 - 34.0 pg   MCHC 32.6  30.0 - 36.0 g/dL   RDW 18.3 (*) 11.5 - 15.5 %   Platelets 394  150 - 400 K/uL   Neutrophils Relative % 54  43 - 77 %   Neutro Abs 4.4  1.7 - 7.7 K/uL   Lymphocytes Relative 33  12 - 46 %   Lymphs Abs 2.7  0.7 - 4.0 K/uL   Monocytes Relative 9  3 - 12 %   Monocytes Absolute 0.8  0.1 - 1.0 K/uL   Eosinophils Relative 3  0 - 5 %   Eosinophils Absolute 0.2  0.0 - 0.7 K/uL   Basophils Relative 0  0 - 1 %   Basophils Absolute 0.0  0.0 - 0.1 K/uL  BASIC METABOLIC PANEL     Status: Abnormal   Collection Time    01/21/14  1:15 AM      Result Value Ref Range   Sodium 137  137 - 147 mEq/L   Potassium 3.9  3.7 - 5.3 mEq/L   Chloride 102  96 - 112 mEq/L   CO2 20  19 - 32 mEq/L   Glucose, Bld 109 (*) 70 - 99 mg/dL   BUN 7  6 - 23 mg/dL   Creatinine, Ser 0.70  0.50 - 1.10 mg/dL   Calcium 10.2  8.4 - 10.5 mg/dL   GFR calc non Af Amer >90  >90 mL/min   GFR calc Af Amer >90  >90 mL/min  WET PREP, GENITAL  Status: Abnormal   Collection Time    01/21/14  1:30 AM      Result Value Ref Range   Yeast Wet Prep HPF POC NONE SEEN  NONE SEEN   Trich, Wet Prep MODERATE (*) NONE SEEN    Clue Cells Wet Prep HPF POC FEW (*) NONE SEEN   WBC, Wet Prep HPF POC MODERATE (*) NONE SEEN   US Transvaginal Non-ob  01/21/2014   CLINICAL DATA:  Severe pelvic pain.  EXAM: TRANSABDOMINAL AND TRANSVAGINAL ULTRASOUND OF PELVIS  DOPPLER ULTRASOUND OF OVARIES  TECHNIQUE: Both transabdominal and transvaginal ultrasound examinations of the pelvis were performed. Transabdominal technique was performed for global imaging of the pelvis including uterus, ovaries, adnexal regions, and pelvic cul-de-sac.  It was necessary to proceed with endovaginal exam following the transabdominal exam to visualize the uterus and ovaries in greater detail. Color and duplex Doppler ultrasound was utilized to evaluate blood flow to the ovaries.  COMPARISON:  None.  FINDINGS: Uterus  Measurements: 10.6 x 5.6 x 6.6 cm. Multiple fibroids are seen. A 3.8 x 3.0 x 3.1 cm is seen on the right side, a 1.4 x 1.3 x 1.2 cm left-sided submucosal fibroid is seen, and a 1.3 x 1.2 x 1.3 cm posterior fibroid is noted along the posterior wall.  Endometrium  Thickness: 1.6 cm.  No focal abnormality visualized.  Right ovary  Measurements: 1.8 x 1.3 x 1.6 cm. Normal appearance/no adnexal mass.  Left ovary  Measurements: 3.7 x 2.0 x 2.4 cm. Normal appearance/no adnexal mass.  Pulsed Doppler evaluation of both ovaries demonstrates normal low-resistance arterial and venous waveforms.  Other findings  No free fluid is seen within the pelvic cul-de-sac.  IMPRESSION: 1. No acute abnormality seen within the pelvis. No evidence for ovarian torsion. 2. Multiple fibroids seen within the uterus, measuring up to 3.8 cm in size.   Electronically Signed   By: Garald Balding M.D.   On: 01/21/2014 05:14   US Pelvis Complete  01/21/2014   CLINICAL DATA:  Severe pelvic pain.  EXAM: TRANSABDOMINAL AND TRANSVAGINAL ULTRASOUND OF PELVIS  DOPPLER ULTRASOUND OF OVARIES  TECHNIQUE: Both transabdominal and transvaginal ultrasound examinations of the pelvis were performed.  Transabdominal technique was performed for global imaging of the pelvis including uterus, ovaries, adnexal regions, and pelvic cul-de-sac.  It was necessary to proceed with endovaginal exam following the transabdominal exam to visualize the uterus and ovaries in greater detail. Color and duplex Doppler ultrasound was utilized to evaluate blood flow to the ovaries.  COMPARISON:  None.  FINDINGS: Uterus  Measurements: 10.6 x 5.6 x 6.6 cm. Multiple fibroids are seen. A 3.8 x 3.0 x 3.1 cm is seen on the right side, a 1.4 x 1.3 x 1.2 cm left-sided submucosal fibroid is seen, and a 1.3 x 1.2 x 1.3 cm posterior fibroid is noted along the posterior wall.  Endometrium  Thickness: 1.6 cm.  No focal abnormality visualized.  Right ovary  Measurements: 1.8 x 1.3 x 1.6 cm. Normal appearance/no adnexal mass.  Left ovary  Measurements: 3.7 x 2.0 x 2.4 cm. Normal appearance/no adnexal mass.  Pulsed Doppler evaluation of both ovaries demonstrates normal low-resistance arterial and venous waveforms.  Other findings  No free fluid is seen within the pelvic cul-de-sac.  IMPRESSION: 1. No acute abnormality seen within the pelvis. No evidence for ovarian torsion. 2. Multiple fibroids seen within the uterus, measuring up to 3.8 cm in size.   Electronically Signed   By: Francoise Schaumann.D.  On: 01/21/2014 05:14   [place] Art/ven Flow Abd Pelv Doppler  01/21/2014   CLINICAL DATA:  Severe pelvic pain.  EXAM: TRANSABDOMINAL AND TRANSVAGINAL ULTRASOUND OF PELVIS  DOPPLER ULTRASOUND OF OVARIES  TECHNIQUE: Both transabdominal and transvaginal ultrasound examinations of the pelvis were performed. Transabdominal technique was performed for global imaging of the pelvis including uterus, ovaries, adnexal regions, and pelvic cul-de-sac.  It was necessary to proceed with endovaginal exam following the transabdominal exam to visualize the uterus and ovaries in greater detail. Color and duplex Doppler ultrasound was utilized to evaluate blood flow to the  ovaries.  COMPARISON:  None.  FINDINGS: Uterus  Measurements: 10.6 x 5.6 x 6.6 cm. Multiple fibroids are seen. A 3.8 x 3.0 x 3.1 cm is seen on the right side, a 1.4 x 1.3 x 1.2 cm left-sided submucosal fibroid is seen, and a 1.3 x 1.2 x 1.3 cm posterior fibroid is noted along the posterior wall.  Endometrium  Thickness: 1.6 cm.  No focal abnormality visualized.  Right ovary  Measurements: 1.8 x 1.3 x 1.6 cm. Normal appearance/no adnexal mass.  Left ovary  Measurements: 3.7 x 2.0 x 2.4 cm. Normal appearance/no adnexal mass.  Pulsed Doppler evaluation of both ovaries demonstrates normal low-resistance arterial and venous waveforms.  Other findings  No free fluid is seen within the pelvic cul-de-sac.  IMPRESSION: 1. No acute abnormality seen within the pelvis. No evidence for ovarian torsion. 2. Multiple fibroids seen within the uterus, measuring up to 3.8 cm in size.   Electronically Signed   By: Garald Balding M.D.   On: 01/21/2014 05:14   Dg Abd Acute W/chest  01/21/2014   CLINICAL DATA:  Abdominal pain  EXAM: ACUTE ABDOMEN SERIES (ABDOMEN 2 VIEW & CHEST 1 VIEW)  COMPARISON:  CT 01/03/2014  FINDINGS: Lung are clear.  No free air beneath the diaphragm.  No dilated loops of large or small bowel. The multiple surgical staples in the right lower quadrant consistent with bowel anastomosis. There is small amount of gas the rectum. No pathologic calcifications.  IMPRESSION: 1. No intraperitoneal free air. 2. No bowel obstruction. 3. Evidence of prior bowel surgery.   Electronically Signed   By: Suzy Bouchard M.D.   On: 01/21/2014 02:52    MAU Course  Procedures None  MDM Patient treated with Flagyl at [facility] today Patient requests partner treatment for her S.O who is present and confirms no allergies  Assessment and Plan  A: Trichomonas Uterine Fibroid Menorrhagia  P: Discharge home Rx for Flagyl given to patient's partner Rx for Ferrous Sulfate sent to patient's pharmacy Rx for Diclofenac given to  patient Bleeding precautions discussed Patient given appointment with [place] 01/25/14 at 2:00 pm for further evaluation and management of menorrhagia/fibroids Patient may return to MAU as needed or if her condition were to change or worsen   Farris Has, PA-C  01/21/2014, 5:27 AM

## 2014-01-21 NOTE — ED Provider Notes (Addendum)
CSN: [id number]     Arrival date & time 01/21/14  0033 History   First MD Initiated Contact with Patient 01/21/14 0101     Chief Complaint  Patient presents with  . Abdominal Pain     (Consider location/radiation/quality/duration/timing/severity/associated sxs/prior Treatment) HPI This is a 48 year old female with history of Crohn's disease and ventral hernia. She was seen on the second of this month and had a CT scan which showed terminal ileitis. She was treated with prednisone and Cipro. She had been doing well until she urinated about 2 hours ago. After urinating she developed severe pelvic pain radiating into her rectum. She states the pain is sharp and different from her Crohn's pain both in location and quality pain is somewhat worse with palpation and much worse with movement. She denies nausea and vomiting at the present. She has been moving her bowels which have been less loose than usual.  Past Medical History  Diagnosis Date  . Crohn's disease    Past Surgical History  Procedure Laterality Date  . Abdominal surgery    . Cholecystectomy    . Tonsillectomy    . Tubal ligation     History reviewed. No pertinent family history. History  Substance Use Topics  . Smoking status: Current Every Day Smoker -- 0.50 packs/day  . Smokeless tobacco: Not on file  . Alcohol Use: Yes     Comment: 10   OB History   Grav Para Term Preterm Abortions TAB SAB Ect Mult Living                 Review of Systems  All other systems reviewed and are negative.   Allergies  Codeine; Penicillins; and Percocet  Home Medications   Prior to Admission medications   Medication Sig Start Date End Date Taking? Authorizing Abigael Mogle  calcium carbonate (TUMS - DOSED IN MG ELEMENTAL CALCIUM) 500 MG chewable tablet Chew 2 tablets by mouth 3 (three) times daily as needed. Stomach indigestion   Yes Historical Crescent Gotham, MD  ciprofloxacin (CIPRO) 500 MG tablet Take 1 tablet (500 mg total) by mouth 2  (two) times daily. 01/03/14   Kalman Drape, MD  HYDROcodone-acetaminophen (NORCO/VICODIN) 5-325 MG per tablet Take 2 tablets by mouth every 4 (four) hours as needed for moderate pain or severe pain. 01/03/14   Kalman Drape, MD  predniSONE (DELTASONE) 20 MG tablet Take 3 tablets (60 mg total) by mouth daily. 01/03/14   Kalman Drape, MD   BP 142/105  Pulse 94  Temp(Src) 98 F (36.7 C) (Oral)  Resp 20  SpO2 100%  Physical Exam General: Well-developed, well-nourished female in no acute distress; appearance consistent with age of record HENT: normocephalic; atraumatic Eyes: pupils equal, round and reactive to light; extraocular muscles intact Neck: supple Heart: regular rate and rhythm Lungs: clear to auscultation bilaterally Abdomen: soft; nondistended; suprapubic tenderness; reducible ventral hernia; bowel sounds present Rectal: Normal sphincter tone; no stool in vault; palpation of the rectum does not reproduce the pain of the chief complaint. GU: Normal external genitalia; white vaginal discharge; no vaginal bleeding; no cervical erythema; cervical motion tenderness; right adnexal tenderness Extremities: No deformity; full range of motion; pulses normal Neurologic: Awake, alert and oriented; motor function intact in all extremities and symmetric; no facial droop Skin: Warm and dry Psychiatric: Normal mood and affect    ED Course  Procedures (including critical care time)  MDM   Nursing notes and vitals signs, including pulse oximetry, reviewed.  Summary of  this visit's results, reviewed by myself:  Labs:  Results for orders placed during the hospital encounter of 01/21/14 (from the past 24 hour(s))  URINALYSIS, ROUTINE W REFLEX MICROSCOPIC     Status: Abnormal   Collection Time    01/21/14 12:51 AM      Result Value Ref Range   Color, Urine YELLOW  YELLOW   APPearance CLEAR  CLEAR   Specific Gravity, Urine 1.010  1.005 - 1.030   pH 5.5  5.0 - 8.0   Glucose, UA NEGATIVE   NEGATIVE mg/dL   Hgb urine dipstick SMALL (*) NEGATIVE   Bilirubin Urine NEGATIVE  NEGATIVE   Ketones, ur NEGATIVE  NEGATIVE mg/dL   Protein, ur NEGATIVE  NEGATIVE mg/dL   Urobilinogen, UA 0.2  0.0 - 1.0 mg/dL   Nitrite NEGATIVE  NEGATIVE   Leukocytes, UA NEGATIVE  NEGATIVE  URINE MICROSCOPIC-ADD ON     Status: None   Collection Time    01/21/14 12:51 AM      Result Value Ref Range   Squamous Epithelial / LPF RARE  RARE   WBC, UA 3-6  <3 WBC/hpf   RBC / HPF 0-2  <3 RBC/hpf   Bacteria, UA RARE  RARE   Urine-Other TRICHOMONAS PRESENT    PREGNANCY, URINE     Status: None   Collection Time    01/21/14 12:51 AM      Result Value Ref Range   Preg Test, Ur NEGATIVE  NEGATIVE  CBC WITH DIFFERENTIAL     Status: Abnormal   Collection Time    01/21/14  1:15 AM      Result Value Ref Range   WBC 8.2  4.0 - 10.5 K/uL   RBC 3.87  3.87 - 5.11 MIL/uL   Hemoglobin 10.8 (*) 12.0 - 15.0 g/dL   HCT 33.1 (*) 36.0 - 46.0 %   MCV 85.5  78.0 - 100.0 fL   MCH 27.9  26.0 - 34.0 pg   MCHC 32.6  30.0 - 36.0 g/dL   RDW 18.3 (*) 11.5 - 15.5 %   Platelets 394  150 - 400 K/uL   Neutrophils Relative % 54  43 - 77 %   Neutro Abs 4.4  1.7 - 7.7 K/uL   Lymphocytes Relative 33  12 - 46 %   Lymphs Abs 2.7  0.7 - 4.0 K/uL   Monocytes Relative 9  3 - 12 %   Monocytes Absolute 0.8  0.1 - 1.0 K/uL   Eosinophils Relative 3  0 - 5 %   Eosinophils Absolute 0.2  0.0 - 0.7 K/uL   Basophils Relative 0  0 - 1 %   Basophils Absolute 0.0  0.0 - 0.1 K/uL  BASIC METABOLIC PANEL     Status: Abnormal   Collection Time    01/21/14  1:15 AM      Result Value Ref Range   Sodium 137  137 - 147 mEq/L   Potassium 3.9  3.7 - 5.3 mEq/L   Chloride 102  96 - 112 mEq/L   CO2 20  19 - 32 mEq/L   Glucose, Bld 109 (*) 70 - 99 mg/dL   BUN 7  6 - 23 mg/dL   Creatinine, Ser 0.70  0.50 - 1.10 mg/dL   Calcium 10.2  8.4 - 10.5 mg/dL   GFR calc non Af Amer >90  >90 mL/min   GFR calc Af Amer >90  >90 mL/min  WET PREP, GENITAL  Status: Abnormal   Collection Time    01/21/14  1:30 AM      Result Value Ref Range   Yeast Wet Prep HPF POC NONE SEEN  NONE SEEN   Trich, Wet Prep MODERATE (*) NONE SEEN   Clue Cells Wet Prep HPF POC FEW (*) NONE SEEN   WBC, Wet Prep HPF POC MODERATE (*) NONE SEEN    Imaging Studies: Dg Abd Acute W/chest  01/21/2014   CLINICAL DATA:  Abdominal pain  EXAM: ACUTE ABDOMEN SERIES (ABDOMEN 2 VIEW & CHEST 1 VIEW)  COMPARISON:  CT 01/03/2014  FINDINGS: Lung are clear.  No free air beneath the diaphragm.  No dilated loops of large or small bowel. The multiple surgical staples in the right lower quadrant consistent with bowel anastomosis. There is small amount of gas the rectum. No pathologic calcifications.  IMPRESSION: 1. No intraperitoneal free air. 2. No bowel obstruction. 3. Evidence of prior bowel surgery.   Electronically Signed   By: Suzy Bouchard M.D.   On: 01/21/2014 02:52   3:06 AM Will transfer to [facility] for emergent [place] to evaluate for ovarian torsion or cyst. Patient's pain improved with IV medications.    Wynetta Fines, MD 01/21/14 1443  Wynetta Fines, MD 01/21/14 1540

## 2014-01-23 LAB — GC/CHLAMYDIA PROBE AMP
CT PROBE, AMP APTIMA: NEGATIVE
GC Probe RNA: NEGATIVE

## 2014-01-25 ENCOUNTER — Telehealth: Payer: Self-pay | Admitting: *Deleted

## 2014-01-25 ENCOUNTER — Ambulatory Visit: Payer: Managed Care, Other (non HMO) | Admitting: Obstetrics & Gynecology

## 2014-01-25 ENCOUNTER — Encounter: Payer: Self-pay | Admitting: *Deleted

## 2014-01-25 NOTE — Telephone Encounter (Signed)
Letter sent to pt

## 2014-01-25 NOTE — Telephone Encounter (Signed)
Jill Terrell missed a scheduled appointment today for MAU followup for menorrhagia/fibroids. Called and left message that she missed a scheduled appointment - please call back and reschedule.

## 2014-02-10 ENCOUNTER — Ambulatory Visit (INDEPENDENT_AMBULATORY_CARE_PROVIDER_SITE_OTHER): Payer: Managed Care, Other (non HMO) | Admitting: Surgery

## 2014-02-24 ENCOUNTER — Encounter: Payer: Self-pay | Admitting: General Practice

## 2014-03-01 ENCOUNTER — Encounter: Payer: Self-pay | Admitting: General Practice

## 2015-12-02 IMAGING — CT CT ABD-PELV W/ CM
2 of 5 series · 16 of 46 positions shown, 18 images · IV contrast (Omni 300)
Comparison: None.

CLINICAL DATA: Crohn's disease, hernia, 3 week abdominal pain and
distention.

EXAM:
CT ABDOMEN AND PELVIS WITH CONTRAST
TECHNIQUE: Multidetector CT imaging of the abdomen and pelvis was performed
using the standard protocol following bolus administration of
intravenous contrast.
CONTRAST:  100 cc Omnipaque 300

[Series 2: abd/ pelvis 5.0 i30f 1 · axial · 0.70mm/px · z∈[-585,-200]mm · 13 of 87 slices shown, 15 images]
[im 5/87  soft-tissue]
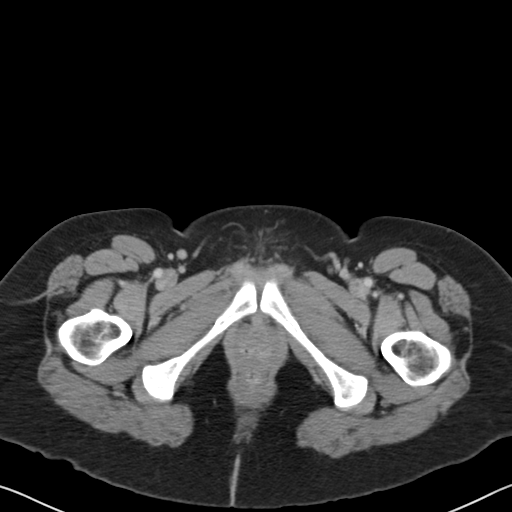
[im 5/87  bone]
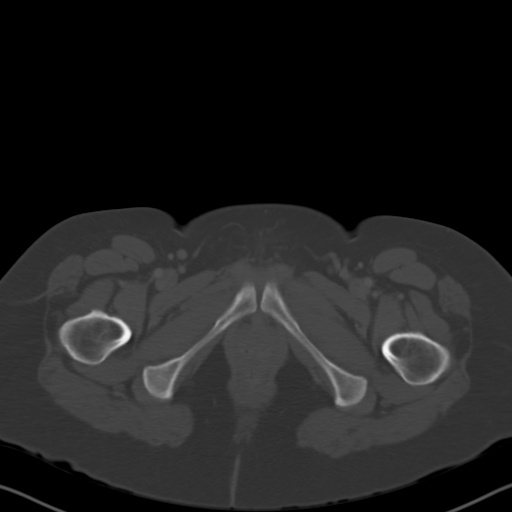
[im 14/87  soft-tissue]
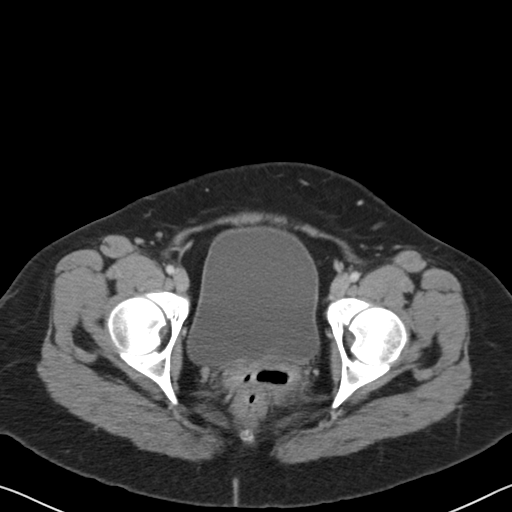
[im 19/87  soft-tissue]
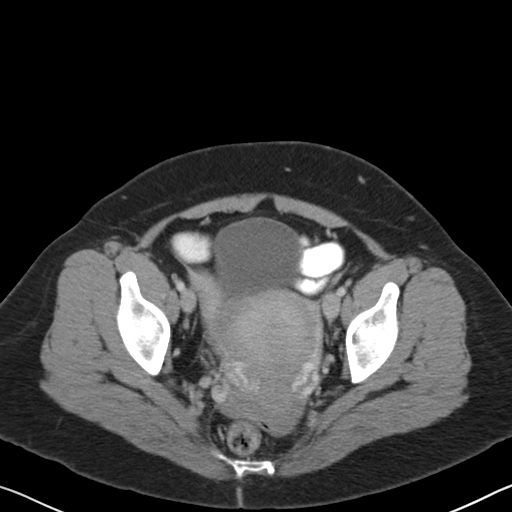
[im 23/87  soft-tissue]
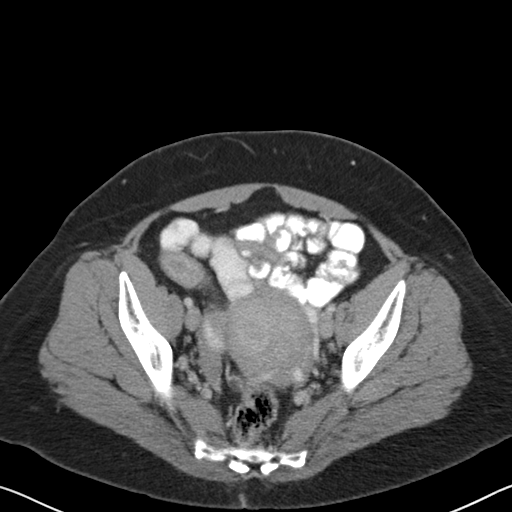
[im 32/87  soft-tissue]
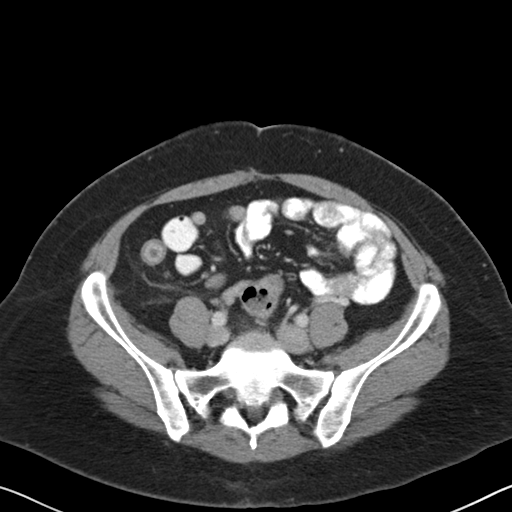
[im 37/87  soft-tissue]
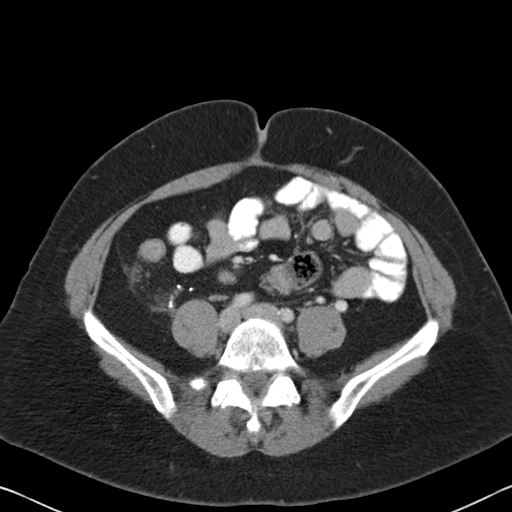
[im 46/87  soft-tissue]
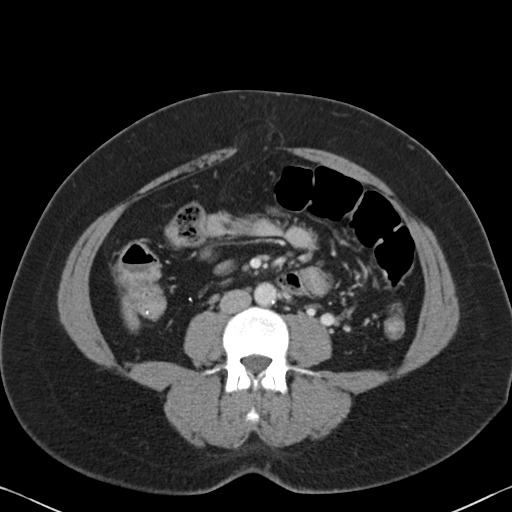
[im 50/87  soft-tissue]
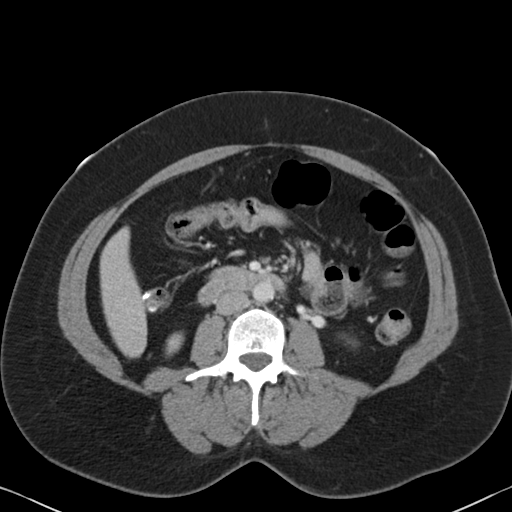
[im 55/87  soft-tissue]
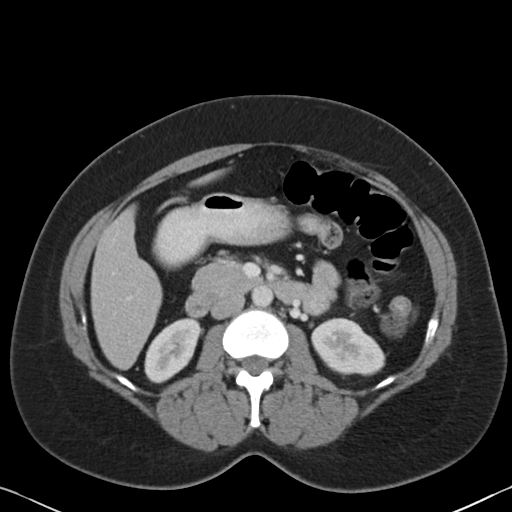
[im 55/87  bone]
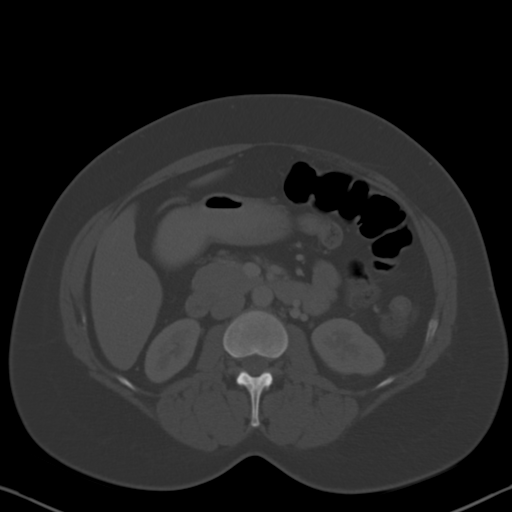
[im 64/87  soft-tissue]
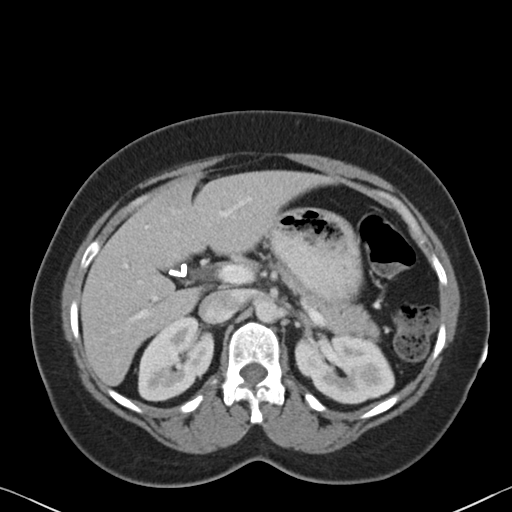
[im 68/87  soft-tissue]
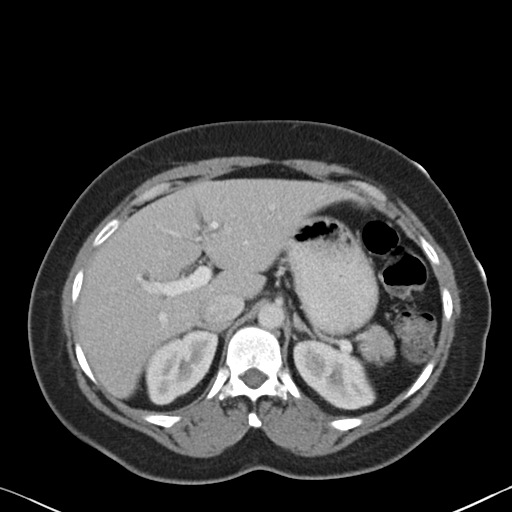
[im 73/87  soft-tissue]
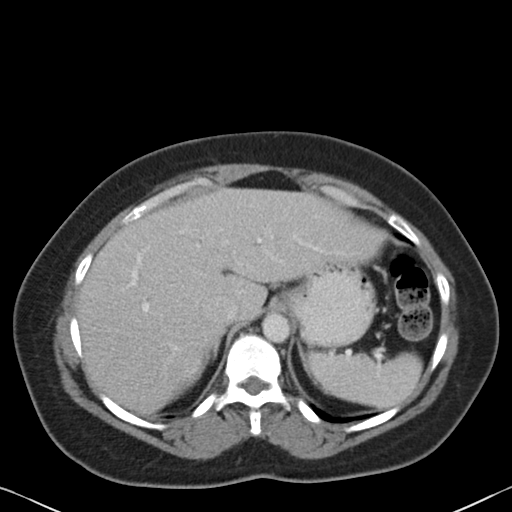
[im 82/87  soft-tissue]
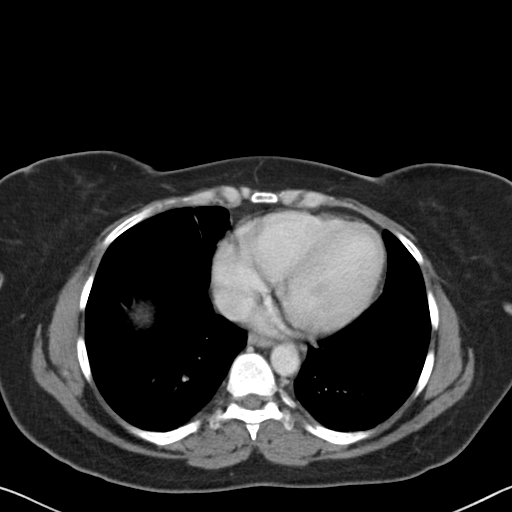

[Series 5: coronals · coronal · 0.70mm/px · 3 of 111 slices shown]
[im 37/111  soft-tissue]
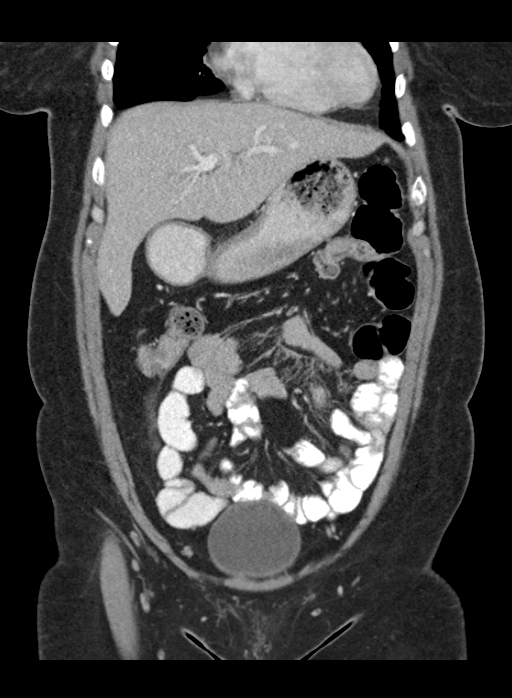
[im 49/111  soft-tissue]
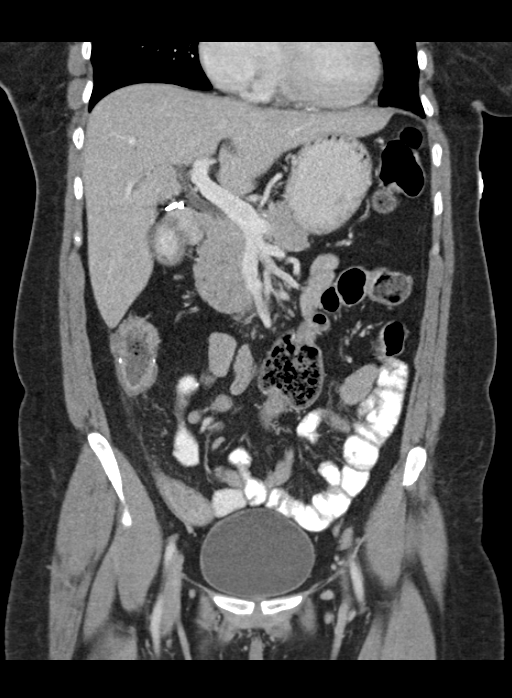
[im 62/111  soft-tissue]
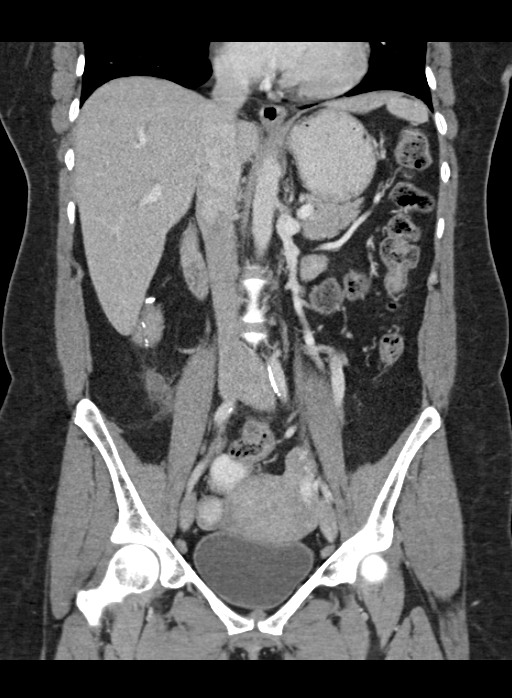

[16 of 46 positions shown; findings below may reference images not displayed]

FINDINGS: Included view of the lung bases are clear. Visualized heart and
pericardium are unremarkable.

The liver, spleen, pancreas and adrenal glands are unremarkable.
Status post cholecystectomy.

Status post apparent sick CT scanning, with circumferential wall
thickening, mesenteric edema of the distal ileum extending to the
surgical anastomosis, coronal 44/111. Enteric contrast has not yet
reached the distal small bowel. No pneumatosis, no abscess. The
stomach, large bowel are normal in course and caliber without
inflammatory changes. No intraperitoneal free fluid nor free air.

Kidneys are orthotopic, demonstrating symmetric enhancement without
nephrolithiasis, hydronephrosis or renal masses. The unopacified
ureters are normal in course and caliber. Delayed imaging through
the kidneys demonstrates symmetric prompt excretion to the proximal
urinary collecting system. Urinary bladder is partially distended
and unremarkable.

Aortoiliac vessels are normal in course and caliber. No
lymphadenopathy by CT size criteria. Mildly enhancing 2.9 cm mass in
right uterus most consistent with a leiomyoma. Moderate fat
containing ventral hernia.
IMPRESSION: Inflammatory changes of the distal ileum likely reflecting acute
Crohn's flare, without abscess or obstruction, status post apparent
cecectomy.

  By: Humty Bundela
